# Patient Record
Sex: Female | Born: 2000 | Race: Black or African American | Hispanic: No | Marital: Single | Smoking: Never smoker
Health system: Southern US, Community
[De-identification: ages and names within clinical notes are randomized; demographics above are authoritative.]

## PROBLEM LIST (undated history)

## (undated) DIAGNOSIS — L309 Dermatitis, unspecified: Secondary | ICD-10-CM

## (undated) DIAGNOSIS — J45909 Unspecified asthma, uncomplicated: Secondary | ICD-10-CM

## (undated) DIAGNOSIS — F419 Anxiety disorder, unspecified: Secondary | ICD-10-CM

## (undated) HISTORY — DX: Dermatitis, unspecified: L30.9

## (undated) HISTORY — DX: Unspecified asthma, uncomplicated: J45.909

## (undated) HISTORY — DX: Anxiety disorder, unspecified: F41.9

---
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
## 2021-01-16 ENCOUNTER — Other Ambulatory Visit: Payer: Self-pay | Admitting: Nurse Practitioner

## 2021-01-16 DIAGNOSIS — N6310 Unspecified lump in the right breast, unspecified quadrant: Secondary | ICD-10-CM

## 2021-02-19 ENCOUNTER — Other Ambulatory Visit: Payer: Self-pay | Admitting: Nurse Practitioner

## 2021-02-19 ENCOUNTER — Ambulatory Visit
Admission: RE | Admit: 2021-02-19 | Discharge: 2021-02-19 | Disposition: A | Discharge status: Home / Self Care | Payer: Medicaid Other | Source: Ambulatory Visit | Attending: Nurse Practitioner | Admitting: Nurse Practitioner

## 2021-02-19 DIAGNOSIS — N6310 Unspecified lump in the right breast, unspecified quadrant: Secondary | ICD-10-CM

## 2021-05-15 ENCOUNTER — Other Ambulatory Visit (INDEPENDENT_AMBULATORY_CARE_PROVIDER_SITE_OTHER): Payer: Medicaid Other

## 2021-05-15 ENCOUNTER — Ambulatory Visit (INDEPENDENT_AMBULATORY_CARE_PROVIDER_SITE_OTHER): Payer: Medicaid Other | Admitting: Gastroenterology

## 2021-05-15 ENCOUNTER — Encounter: Payer: Self-pay | Admitting: Gastroenterology

## 2021-05-15 VITALS — BP 100/62 | HR 102 | Ht 69.0 in | Wt 140.4 lb

## 2021-05-15 DIAGNOSIS — K219 Gastro-esophageal reflux disease without esophagitis: Secondary | ICD-10-CM | POA: Diagnosis not present

## 2021-05-15 DIAGNOSIS — R1110 Vomiting, unspecified: Secondary | ICD-10-CM | POA: Diagnosis not present

## 2021-05-15 DIAGNOSIS — R110 Nausea: Secondary | ICD-10-CM

## 2021-05-15 DIAGNOSIS — R1310 Dysphagia, unspecified: Secondary | ICD-10-CM

## 2021-05-15 DIAGNOSIS — Z862 Personal history of diseases of the blood and blood-forming organs and certain disorders involving the immune mechanism: Secondary | ICD-10-CM

## 2021-05-15 LAB — CBC WITH DIFFERENTIAL/PLATELET
Basophils Absolute: 0 10*3/uL (ref 0.0–0.1)
Basophils Relative: 0.8 % (ref 0.0–3.0)
Eosinophils Absolute: 0 10*3/uL (ref 0.0–0.7)
Eosinophils Relative: 0.8 % (ref 0.0–5.0)
HCT: 36 % (ref 36.0–46.0)
Hemoglobin: 11.2 g/dL — ABNORMAL LOW (ref 12.0–15.0)
Lymphocytes Relative: 47.2 % — ABNORMAL HIGH (ref 12.0–46.0)
Lymphs Abs: 2.4 10*3/uL (ref 0.7–4.0)
MCHC: 31.1 g/dL (ref 30.0–36.0)
MCV: 76.5 fl — ABNORMAL LOW (ref 78.0–100.0)
Monocytes Absolute: 0.5 10*3/uL (ref 0.1–1.0)
Monocytes Relative: 10 % (ref 3.0–12.0)
Neutro Abs: 2.1 10*3/uL (ref 1.4–7.7)
Neutrophils Relative %: 41.2 % — ABNORMAL LOW (ref 43.0–77.0)
Platelets: 385 10*3/uL (ref 150.0–400.0)
RBC: 4.71 Mil/uL (ref 3.87–5.11)
RDW: 18.7 % — ABNORMAL HIGH (ref 11.5–14.6)
WBC: 5.2 10*3/uL (ref 4.5–10.5)

## 2021-05-15 LAB — COMPREHENSIVE METABOLIC PANEL
ALT: 8 U/L (ref 0–35)
AST: 17 U/L (ref 0–37)
Albumin: 5 g/dL (ref 3.5–5.2)
Alkaline Phosphatase: 52 U/L (ref 39–117)
BUN: 12 mg/dL (ref 6–23)
CO2: 24 mEq/L (ref 19–32)
Calcium: 10.1 mg/dL (ref 8.4–10.5)
Chloride: 101 mEq/L (ref 96–112)
Creatinine, Ser: 0.83 mg/dL (ref 0.40–1.20)
GFR: 101.31 mL/min (ref >60.00)
Glucose, Bld: 88 mg/dL (ref 70–99)
Potassium: 4.1 mEq/L (ref 3.5–5.1)
Sodium: 133 mEq/L — ABNORMAL LOW (ref 135–145)
Total Bilirubin: 0.4 mg/dL (ref 0.2–1.2)
Total Protein: 8.4 g/dL — ABNORMAL HIGH (ref 6.0–8.3)

## 2021-05-15 MED ORDER — PANTOPRAZOLE SODIUM 40 MG PO TBEC: 40.0000 mg | DELAYED_RELEASE_TABLET | Freq: Two times a day (BID) | ORAL | 3 refills | Status: AC

## 2021-05-15 NOTE — Progress Notes (Signed)
? ?Referring Provider: Deloris Ping, NP ?Primary Care Physician:  System, Provider Not In ? ?Reason for Consultation:  Loss of appetite ? ? ?IMPRESSION:  ?Reflux ?Dysphagia ?Nausea ?Rumination ?Globus ?Weight loss ?History of anemia ?Anxiety - which may be worsening GI symptoms ? ? ?PLAN: ?- CBC, CMP (she will send recent lab results so that these don't need to be drawn today) ?- H pylori, TTGA , IgA ?- Pantoprazole 40 mg BID ?- Upper GI series because she would prefer to start the evaluation without endoscopy ?- EGD with biopsies if UGI series is non-diagnostic ? ?HPI: Karen Blackburn is a 21 y.o. female referred by NP Margo Aye at [facility] for loss of appetite, reflux, and difficulty swallowing.  She has anemia, anxiety, asthma, and depression. She is a Medical sales representative. ? ?She reports many years of poor appetite, nausea, abdominal pain, intermittent dysphagia. There has been an associated weight loss.  She notes having rumination.  Symptom worse during the week leading up to her period. Even with better control of her anxiety her GI symptoms have not improved.  ? ?She has tried probiotics and Mylanta.  ? ?She thinks she has been anemic but I do not have any prior labs to know her baseline.  ? ?There is no known family history of colon cancer or polyps. No family history of stomach cancer or other GI malignancy. No family history of inflammatory bowel disease or celiac.  ? ? ?Past Medical History:  ?Diagnosis Date  ? Anxiety   ? Asthma   ? Eczema   ? ? ?History reviewed. No pertinent surgical history. ? ? ?Current Outpatient Medications  ?Medication Sig Dispense Refill  ? pantoprazole (PROTONIX) 40 MG tablet Take 1 tablet (40 mg total) by mouth 2 (two) times daily before a meal. 60 tablet 3  ? ?No current facility-administered medications for this visit.  ? ? ?Allergies as of 05/15/2021 - never reviewed  ?Allergen Reaction Noted  ? Benadryl [diphenhydramine] Rash 05/15/2021  ? Pimecrolimus   08/06/2010  ? ? ?Family History  ?Problem Relation Age of Onset  ? Heart disease Mother   ? Stomach cancer Father   ? Colon cancer Neg Hx   ? Rectal cancer Neg Hx   ? Esophageal cancer Neg Hx   ? ? ?Social History  ? ?Socioeconomic History  ? Marital status: Single  ?  Spouse name: Not on file  ? Number of children: Not on file  ? Years of education: Not on file  ? Highest education level: Not on file  ?Occupational History  ? Occupation: student  ?Tobacco Use  ? Smoking status: Never  ? Smokeless tobacco: Never  ?Vaping Use  ? Vaping Use: Never used  ?Substance and Sexual Activity  ? Alcohol use: Never  ? Drug use: Not Currently  ?  Types: Marijuana  ? Sexual activity: Not on file  ?Other Topics Concern  ? Not on file  ?Social History Narrative  ? Not on file  ? ?Social Determinants of Health  ? ?Financial Resource Strain: Not on file  ?Food Insecurity: Not on file  ?Transportation Needs: Not on file  ?Physical Activity: Not on file  ?Stress: Not on file  ?Social Connections: Not on file  ?Intimate Partner Violence: Not on file  ? ? ?Review of Systems: ?12 system ROS is negative except as noted above with the addition of anxiety and depression.  ? ?Physical Exam: ?General:   Alert,  well-nourished, pleasant and cooperative in  NAD ?Head:  Normocephalic and atraumatic. ?Eyes:  Sclera clear, no icterus.   Conjunctiva pink. ?Ears:  Normal auditory acuity. ?Nose:  No deformity, discharge,  or lesions. ?Mouth:  No deformity or lesions.   ?Neck:  Supple; no masses or thyromegaly. ?Lungs:  Clear throughout to auscultation.   No wheezes. ?Heart:  Regular rate and rhythm; no murmurs. ?Abdomen:  Soft, nontender, nondistended, normal bowel sounds, no rebound or guarding. No hepatosplenomegaly.   ?Rectal:  Deferred  ?Msk:  Symmetrical. No boney deformities ?LAD: No inguinal or umbilical LAD ?Extremities:  No clubbing or edema. ?Neurologic:  Alert and  oriented x4;  grossly nonfocal ?Skin:  Intact without significant lesions or  rashes. ?Psych:  Alert and cooperative. Normal mood and affect. ? ? ? ? ?Kariann Wecker L. Orvan Falconer, MD, MPH ?05/17/2021, 2:13 PM ? ? ? ?  ?

## 2021-05-15 NOTE — Patient Instructions (Signed)
If you are age 21 or older, your body mass index should be between 23-30. Your Body mass index is 20.73 kg/m?Marland Kitchen If this is out of the aforementioned range listed, please consider follow up with your Primary Care Provider. ? ?If you are age 43 or younger, your body mass index should be between 19-25. Your Body mass index is 20.73 kg/m?Marland Kitchen If this is out of the aformentioned range listed, please consider follow up with your Primary Care Provider.  ? ?________________________________________________________ ? ?The [facility] providers would like to encourage you to use [facility] to communicate with providers for non-urgent requests or questions.  Due to long hold times on the telephone, sending your provider a message by [facility] may be a faster and more efficient way to get a response.  Please allow 48 business hours for a response.  Please remember that this is for non-urgent requests.  ?_______________________________________________________ ? ?It has been recommended to you by your physician that you have an EGD completed. Per your request, we did not schedule the procedure today. Please contact our office at [phone] should you decide to have the procedure completed. You will be scheduled for a pre-visit and procedure at that time.  ? ?Your provider has requested that you go to the basement level for lab work before leaving today. Press "B" on the elevator. The lab is located at the first door on the left as you exit the elevator. ? ?You have been scheduled for an Upper GI Series at [facility]. Your appointment is on 05-29-2021 at 930am. Please arrive 30 minutes prior to your test for registration. Make sure not to eat or drink anything after midnight on the night before your test. If you need to reschedule, please call radiology at [phone]. ?________________________________________________________________ ?An upper GI series uses x rays to help diagnose problems of the upper GI tract, which includes  the esophagus, stomach, and duodenum. The duodenum is the first part of the small intestine. ?An upper GI series is conducted by a radiology technologist or a radiologist--a doctor who specializes in x-ray imaging--at a hospital or outpatient center. ?While sitting or standing in front of an x-ray machine, the patient drinks barium liquid, which is often white and has a chalky consistency and taste. The barium liquid coats the lining of the upper GI tract and makes signs of disease show up more clearly on x rays. X-ray video, called fluoroscopy, is used to view the barium liquid moving through the esophagus, stomach, and duodenum. ?Additional x rays and fluoroscopy are performed while the patient lies on an x-ray table. To fully coat the upper GI tract with barium liquid, the technologist or radiologist may press on the abdomen or ask the patient to change position. Patients hold still in various positions, allowing the technologist or radiologist to take x rays of the upper GI tract at different angles. If a technologist conducts the upper GI series, a radiologist will later examine the images to look for problems. ? ?This test typically takes about 1 hour to complete. ?__________________________________________________________________ ? ? ?Due to recent changes in healthcare laws, you may see the results of your imaging and laboratory studies on MyChart before your provider has had a chance to review them.  We understand that in some cases there may be results that are confusing or concerning to you. Not all laboratory results come back in the same time frame and the provider may be waiting for multiple results in order to interpret others.  Please  give [place] 48 hours in order for your provider to thoroughly review all the results before contacting the office for clarification of your results.  ? ?Follow up with  ? ?It was a pleasure to see you today! ? ?Thank you for trusting me with your gastrointestinal care!    ? ? ? ?

## 2021-05-16 LAB — IGA: Immunoglobulin A: 131 mg/dL (ref 47–310)

## 2021-05-16 LAB — TISSUE TRANSGLUTAMINASE, IGA: (tTG) Ab, IgA: <1 U/mL

## 2021-05-17 ENCOUNTER — Encounter: Payer: Self-pay | Admitting: Gastroenterology

## 2021-05-18 ENCOUNTER — Encounter: Payer: Self-pay | Admitting: Gastroenterology

## 2021-05-20 ENCOUNTER — Other Ambulatory Visit: Payer: Self-pay

## 2021-05-20 DIAGNOSIS — Z862 Personal history of diseases of the blood and blood-forming organs and certain disorders involving the immune mechanism: Secondary | ICD-10-CM

## 2021-05-23 ENCOUNTER — Other Ambulatory Visit: Payer: Medicaid Other

## 2021-05-23 DIAGNOSIS — Z862 Personal history of diseases of the blood and blood-forming organs and certain disorders involving the immune mechanism: Secondary | ICD-10-CM

## 2021-05-23 DIAGNOSIS — R110 Nausea: Secondary | ICD-10-CM

## 2021-05-23 DIAGNOSIS — R1310 Dysphagia, unspecified: Secondary | ICD-10-CM

## 2021-05-23 DIAGNOSIS — K219 Gastro-esophageal reflux disease without esophagitis: Secondary | ICD-10-CM

## 2021-05-23 DIAGNOSIS — R1110 Vomiting, unspecified: Secondary | ICD-10-CM

## 2021-05-24 LAB — HELICOBACTER PYLORI  SPECIAL ANTIGEN
MICRO NUMBER:: 13259586
SPECIMEN QUALITY: ADEQUATE

## 2021-05-24 LAB — IRON,TIBC AND FERRITIN PANEL
%SAT: 6 % (calc) — ABNORMAL LOW (ref 16–45)
Ferritin: 3 ng/mL — ABNORMAL LOW (ref 16–154)
Iron: 28 ug/dL — ABNORMAL LOW (ref 40–190)
TIBC: 460 mcg/dL (calc) — ABNORMAL HIGH (ref 250–450)

## 2021-05-27 ENCOUNTER — Other Ambulatory Visit: Payer: Self-pay

## 2021-05-27 DIAGNOSIS — D508 Other iron deficiency anemias: Secondary | ICD-10-CM

## 2021-05-29 ENCOUNTER — Encounter (HOSPITAL_COMMUNITY): Payer: Self-pay | Admitting: Radiology

## 2021-05-29 ENCOUNTER — Encounter: Payer: Self-pay | Admitting: Gastroenterology

## 2021-05-29 ENCOUNTER — Ambulatory Visit (HOSPITAL_COMMUNITY)
Admission: RE | Admit: 2021-05-29 | Discharge: 2021-05-29 | Disposition: A | Discharge status: Home / Self Care | Payer: Medicaid Other | Source: Ambulatory Visit | Attending: Gastroenterology | Admitting: Gastroenterology

## 2021-05-29 DIAGNOSIS — R110 Nausea: Secondary | ICD-10-CM | POA: Insufficient documentation

## 2021-05-29 DIAGNOSIS — Z862 Personal history of diseases of the blood and blood-forming organs and certain disorders involving the immune mechanism: Secondary | ICD-10-CM | POA: Diagnosis present

## 2021-05-29 DIAGNOSIS — R1310 Dysphagia, unspecified: Secondary | ICD-10-CM | POA: Diagnosis present

## 2021-05-29 DIAGNOSIS — K219 Gastro-esophageal reflux disease without esophagitis: Secondary | ICD-10-CM | POA: Diagnosis present

## 2021-05-29 DIAGNOSIS — R1110 Vomiting, unspecified: Secondary | ICD-10-CM | POA: Insufficient documentation

## 2021-05-29 NOTE — Progress Notes (Signed)
Noted  

## 2021-05-31 ENCOUNTER — Telehealth: Payer: Self-pay

## 2021-05-31 ENCOUNTER — Encounter: Payer: Self-pay | Admitting: Gastroenterology

## 2021-05-31 NOTE — Telephone Encounter (Signed)
Numerous attempts have been made on behalf of the [facility] to schedule pt as indicated below. In addition, we have sent pt a My Chart message that she has read providing her with [facility] contact #. Letter has also been mailed to pt home address.  ? ?Referral Notes ?Number of Notes: 6 ?. ?Type Date User Summary Attachment  ?General 05/31/2021  8:35 AM Shirlee More Called pt, no answer. Left msg for pt to call back to sch appt.   -  ?Note:   ?Called pt, no answer. Left msg for pt to call back to sch appt.   ?. ?Type Date User Summary Attachment  ?General 05/30/2021  8:35 AM Shirlee More Called pt, no answer. Left msg for pt to call back to sch appt.   -  ?Note:   ?Called pt, no answer. Left msg for pt to call back to sch appt.   ?. ?Type Date User Summary Attachment  ?General 05/29/2021  8:26 AM Shirlee More Called pt, no answer. Left msg for pt to call back to sch appt.   -  ?Note:   ?Called pt, no answer. Left msg for pt to call back to sch appt.   ?. ?Type Date User Summary Attachment  ?General 05/28/2021  9:14 AM Shirlee More - -  ?Note:   ?Called pt, no answer. Left msg for pt to call back to sch appt.   ?

## 2021-06-01 ENCOUNTER — Telehealth: Payer: Self-pay | Admitting: Gastroenterology

## 2021-06-01 NOTE — Telephone Encounter (Signed)
[facility] of [facility].  Patient seen 03/14/2021 for reflux and anxiety ? ?Seen in student [facility] for evaluation of epigastric pain that is worsened over the last month.  She has been seen numerous times at student health for the same.  She reports a gnawing sensation in her epigastric region but is also full simultaneously.  States poor appetite due to this.  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  There is no nausea vomiting, diarrhea, constipation.  Uses ibuprofen regularly when menstruating. ? ?She has a history of depression and anxiety and was prescribed Prozac but has been noncompliant because of things her friends told her about her mood when she was taking it.  Has not seen psych since April 2022.  States that her anxiety is under control. ? ?They recommended that she reestablish care with psychiatry and felt that her symptoms may be caused by underlying mental health troubles. ? ?Liver enzymes normal.  Hemoglobin 10.8, MCV 96.6, RDW 16.5, platelets 401.  Calcium 10.5.  Total protein 8.9.  Albumin 5.3. ?

## 2021-06-03 ENCOUNTER — Telehealth: Payer: Self-pay | Admitting: Physician Assistant

## 2021-06-03 NOTE — Telephone Encounter (Signed)
Scheduled appt per 4/17 referral. Pt is aware of appt date and time. Pt is aware to arrive 15 mins prior to appt time and to bring and updated insurance card. Pt is aware of appt location.   ?

## 2021-06-04 NOTE — Progress Notes (Deleted)
Recall placed per Dr. Dr. Orvan Falconer request. Letter sent via My Chart and mailed to home address. ? ?

## 2021-06-05 ENCOUNTER — Encounter: Payer: Self-pay | Admitting: Gastroenterology

## 2021-06-06 ENCOUNTER — Ambulatory Visit: Payer: Medicaid Other | Admitting: Nurse Practitioner

## 2021-06-17 NOTE — Progress Notes (Signed)
? ? Karen Blackburn ?[phone]   Fax:([phone]) [phone] ? ?CONSULT NOTE ? ?REFERRING PHYSICIAN: Dr. Tarri Glenn ? ?REASON FOR CONSULTATION:  ?Iron deficiency anemia ? ?HPI ?Karen Blackburn is a 21 y.o. female with no significant past medical history except for right breast lump is referred to the clinic for iron deficiency anemia. ? ?The patient's evaluation began when she saw Dr. Tarri Glenn on 05/15/2021 for the chief complaint of loss of appetite.  She reports that her baseline weight at the beginning of the year was around 150 pounds.  She states at the time of her GI evaluation a few months later, that her weight was closer to 135 pounds.  She had extensive blood testing performed as well as upper GI series.  Since her upper GI series was unremarkable, they are going to proceed with an EGD.  This is not scheduled at this time.  The patient is going to be going home to [place] for summer break from college soon.  She is considering being referred to GI in [place] to continue her evaluation.  The patient had a CBC performed by Dr. Tarri Glenn office which showed mild anemia with a low  hemoglobin of 11.2 and a low MCV at 76.5.  Her iron studies showed iron deficiency anemia with a ferritin of 3, iron low at 28, elevated TIBC at 460, and a low saturation at 6%.  She was referred to the clinic for her iron deficiency anemia.  She started taking an iron supplement approximately 4 weeks ago.  She has been fairly compliant with taking this p.o. daily but reports she slacked off the last week and a half or so.  She is taking an over-the-counter iron supplement called for her globin.  She is not sure with the iron content is in this.  She also has an old iron prescription at the house. ? ?I do not have any prior records. To her knowledge, she started having anemia since she was in high school.  She denies ever needing a blood transfusion or an iron infusion before.  ? ?She denies heavy menstrual bleeding and  reports her menstrual cycles last approximately 4 days and that they are only heavy the second day.  On the second day of her cycle, she estimates that she will need 5-6 tampons.  She reports a few months ago she was having 2 menstrual cycles per month but this lasted only 1 to 2 months.   ? ?Regarding symptoms, she reports fatigue.   She denies any lightheadedness or dyspnea on exertion.  She reports she had palpitations "now and then" prior to starting her iron supplements.  She had 1 recent episode of self-limiting epistaxis but otherwise denies history of epistaxis.  She also thinks that she may have had a little bit of bright red blood on the toilet paper after wiping with a recent bowel movement.  She denies gingival bleeding, hemoptysis, hematemesis, or melena.  Denies any changes in bowel habits.  Denies any fever, chills, or night sweats.  She denies any lymphadenopathy. Denies any NSAID use except she may take 2 tablets during her menstrual cycle.   She reports that she has a stable breast lump. She had an ultrasound of this performed a few months ago which recommended follow up in 6 months.  This is scheduled for 08/22/2021.  The patient thinks that she may be in [place] at the time that this is scheduled. Denies any chronic kidney disease. She denies any particular dietary  habits such as being a vegan or vegetarian, but she does not eat a lot of red meat.  She estimates she eats red meat approximately once or twice a week.  Does not get a lot of red meat, maybe 1 or twice.  Denies any history of any bariatric surgery.  She reports craving ice chips. ? ?Her family medical history consist of a father who had stomach cancer.  He was diagnosed in his 50s.  He passed away at the age of 57.  The patient reports her sister has sickle cell disease, although it sounds like she fortunately has not had a lot of complications from this.  The patient denies being tested for this.  Otherwise her mother has a  pacemaker and high cholesterol.  He denies any other family history of any blood disorders or cancers. ? ?The patient is a Ship broker.  She is single does not have any children.  She denies any drug, alcohol, or tobacco use. ? ?   ?HPI ? ?Past Medical History:  ?Diagnosis Date  ? Anxiety   ? Asthma   ? Eczema   ? ? ?No past surgical history on file. ? ?Family History  ?Problem Relation Age of Onset  ? Heart disease Mother   ? Stomach cancer Father   ? Colon cancer Neg Hx   ? Rectal cancer Neg Hx   ? Esophageal cancer Neg Hx   ? ? ?Social History ?Social History  ? ?Tobacco Use  ? Smoking status: Never  ? Smokeless tobacco: Never  ?Vaping Use  ? Vaping Use: Never used  ?Substance Use Topics  ? Alcohol use: Never  ? Drug use: Not Currently  ?  Types: Marijuana  ? ? ?Allergies  ?Allergen Reactions  ? Benadryl [Diphenhydramine] Rash  ? Pimecrolimus   ? ? ?Current Outpatient Medications  ?Medication Sig Dispense Refill  ? cetirizine HCl (ZYRTEC) 5 MG/5ML SOLN Take 85ml by mouth daily for allergies and itching    ? alum & mag hydroxide-simeth (MAALOX PLUS) 400-400-40 MG/5ML suspension Take 10 mLs by mouth 4 (four) times daily as needed. (Patient not taking: Reported on 06/18/2021)    ? pantoprazole (PROTONIX) 40 MG tablet Take 1 tablet (40 mg total) by mouth 2 (two) times daily before a meal. (Patient not taking: Reported on 06/18/2021) 60 tablet 3  ? triamcinolone ointment (KENALOG) 0.1 % Apply topically 2 (two) times daily. (Patient not taking: Reported on 06/18/2021)    ? ?No current facility-administered medications for this visit.  ? ? ?REVIEW OF SYSTEMS:   ?Review of Systems  ?Constitutional: Positive for fatigue.  Positive for unexplained weight loss.  Negative for appetite change, chills, and fever.  ?HENT: Negative for mouth sores, nosebleeds, sore throat and trouble swallowing.   ?Eyes: Negative for eye problems and icterus.  ?Respiratory: Negative for cough, hemoptysis, shortness of breath and wheezing.    ?Cardiovascular: Negative for chest pain and leg swelling.  ?Gastrointestinal: Negative for abdominal pain, constipation, diarrhea, nausea and vomiting.  ?Genitourinary: Negative for bladder incontinence, difficulty urinating, dysuria, frequency and hematuria.   ?Musculoskeletal: Negative for back pain, gait problem, neck pain and neck stiffness.  ?Skin: Negative for itching and rash.  ?Neurological: Negative for dizziness, extremity weakness, gait problem, headaches, light-headedness and seizures.  ?Hematological: Negative for adenopathy. Does not bruise/bleed easily.  ?Psychiatric/Behavioral: Negative for confusion, depression and sleep disturbance. The patient is not nervous/anxious.   ? ? ?PHYSICAL EXAMINATION:  ?Blood pressure 138/79, pulse 85, temperature 98.1 ?F (36.7 ?C),  temperature source Oral, resp. rate 17, height 5\' 9"  (1.753 m), weight 145 lb 1.6 oz (65.8 kg), last menstrual period 05/20/2021, SpO2 100 %, unknown if currently breastfeeding. ? ?ECOG PERFORMANCE STATUS: 1 ? ?Physical Exam  ?Constitutional: Oriented to person, place, and time and well-developed, well-nourished, and in no distress.  ?HENT:  ?Head: Normocephalic and atraumatic.  ?Mouth/Throat: Oropharynx is clear and moist. No oropharyngeal exudate.  ?Eyes: Conjunctivae are normal. Right eye exhibits no discharge. Left eye exhibits no discharge. No scleral icterus.  ?Neck: Normal range of motion. Neck supple.  ?Cardiovascular: Normal rate, regular rhythm, normal heart sounds and intact distal pulses.   ?Pulmonary/Chest: Effort normal and breath sounds normal. No respiratory distress. No wheezes. No rales.  ?Abdominal: Soft. Bowel sounds are normal. Exhibits no distension and no mass. There is no tenderness.  ?Musculoskeletal: Normal range of motion. Exhibits no edema.  ?Lymphadenopathy:  ?  No cervical adenopathy.  ?Neurological: Alert and oriented to person, place, and time. Exhibits normal muscle tone. Gait normal. Coordination normal.   ?Skin: Skin is warm and dry. No rash noted. Not diaphoretic. No erythema. No pallor.  ?Psychiatric: Mood, memory and judgment normal.  ?Vitals reviewed. ? ?LABORATORY DATA: ?Lab Results  ?Component Value Date  ? WBC 5.7 05/0

## 2021-06-18 ENCOUNTER — Inpatient Hospital Stay: Payer: Medicaid Other | Attending: Physician Assistant | Admitting: Physician Assistant

## 2021-06-18 ENCOUNTER — Other Ambulatory Visit: Payer: Self-pay | Admitting: Physician Assistant

## 2021-06-18 ENCOUNTER — Other Ambulatory Visit: Payer: Self-pay

## 2021-06-18 ENCOUNTER — Inpatient Hospital Stay: Payer: Medicaid Other

## 2021-06-18 DIAGNOSIS — R002 Palpitations: Secondary | ICD-10-CM | POA: Diagnosis not present

## 2021-06-18 DIAGNOSIS — R634 Abnormal weight loss: Secondary | ICD-10-CM | POA: Diagnosis not present

## 2021-06-18 DIAGNOSIS — D509 Iron deficiency anemia, unspecified: Secondary | ICD-10-CM

## 2021-06-18 DIAGNOSIS — Z800 Family history of malignant neoplasm of digestive organs: Secondary | ICD-10-CM | POA: Diagnosis not present

## 2021-06-18 DIAGNOSIS — R5383 Other fatigue: Secondary | ICD-10-CM | POA: Insufficient documentation

## 2021-06-18 LAB — CMP (CANCER CENTER ONLY)
ALT: 11 U/L (ref 0–44)
AST: 20 U/L (ref 15–41)
Albumin: 4.4 g/dL (ref 3.5–5.0)
Alkaline Phosphatase: 45 U/L (ref 38–126)
Anion gap: 9 (ref 5–15)
BUN: 10 mg/dL (ref 6–20)
CO2: 24 mmol/L (ref 22–32)
Calcium: 9.7 mg/dL (ref 8.9–10.3)
Chloride: 105 mmol/L (ref 98–111)
Creatinine: 0.72 mg/dL (ref 0.44–1.00)
GFR, Estimated: >60 mL/min (ref >60)
Glucose, Bld: 93 mg/dL (ref 70–99)
Potassium: 3.9 mmol/L (ref 3.5–5.1)
Sodium: 138 mmol/L (ref 135–145)
Total Bilirubin: 0.4 mg/dL (ref 0.3–1.2)
Total Protein: 8.1 g/dL (ref 6.5–8.1)

## 2021-06-18 LAB — CBC WITH DIFFERENTIAL (CANCER CENTER ONLY)
Abs Immature Granulocytes: 0 10*3/uL (ref 0.00–0.07)
Basophils Absolute: 0 10*3/uL (ref 0.0–0.1)
Basophils Relative: 1 %
Eosinophils Absolute: 0.1 10*3/uL (ref 0.0–0.5)
Eosinophils Relative: 2 %
HCT: 34.8 % — ABNORMAL LOW (ref 36.0–46.0)
Hemoglobin: 10.9 g/dL — ABNORMAL LOW (ref 12.0–15.0)
Immature Granulocytes: 0 %
Lymphocytes Relative: 59 %
Lymphs Abs: 3.4 10*3/uL (ref 0.7–4.0)
MCH: 24.8 pg — ABNORMAL LOW (ref 26.0–34.0)
MCHC: 31.3 g/dL (ref 30.0–36.0)
MCV: 79.1 fL — ABNORMAL LOW (ref 80.0–100.0)
Monocytes Absolute: 0.3 10*3/uL (ref 0.1–1.0)
Monocytes Relative: 5 %
Neutro Abs: 1.9 10*3/uL (ref 1.7–7.7)
Neutrophils Relative %: 33 %
Platelet Count: 354 10*3/uL (ref 150–400)
RBC: 4.4 MIL/uL (ref 3.87–5.11)
RDW: 17.8 % — ABNORMAL HIGH (ref 11.5–15.5)
WBC Count: 5.7 10*3/uL (ref 4.0–10.5)
nRBC: 0 % (ref 0.0–0.2)

## 2021-06-18 LAB — VITAMIN B12: Vitamin B-12: 701 pg/mL (ref 180–914)

## 2021-06-18 LAB — IRON AND IRON BINDING CAPACITY (CC-WL,HP ONLY)
Iron: 23 ug/dL — ABNORMAL LOW (ref 28–170)
Saturation Ratios: 4 % — ABNORMAL LOW (ref 10.4–31.8)
TIBC: 564 ug/dL — ABNORMAL HIGH (ref 250–450)
UIBC: 541 ug/dL

## 2021-06-18 LAB — FOLATE: Folate: 11.1 ng/mL (ref >5.9)

## 2021-06-18 LAB — FERRITIN: Ferritin: 4 ng/mL — ABNORMAL LOW (ref 11–307)

## 2021-06-20 ENCOUNTER — Other Ambulatory Visit: Payer: Self-pay

## 2021-06-20 DIAGNOSIS — D509 Iron deficiency anemia, unspecified: Secondary | ICD-10-CM

## 2021-06-20 NOTE — Progress Notes (Signed)
Referral sent to the following: ? ?[facility] ?PH: [phone] ?FX: [phone] ?

## 2021-07-01 ENCOUNTER — Telehealth: Payer: Self-pay | Admitting: Gastroenterology

## 2021-07-01 NOTE — Telephone Encounter (Signed)
We received a call from [facility] from [facility], stating that they received a referral from [facility] to them for iron deficiency anemia. Per Maureen Ralphs, called patient to schedule, however, patient states being unaware of referral, declined scheduling with them.

## 2021-07-02 NOTE — Telephone Encounter (Signed)
Message routed erroneously. Routing to pt provider with [facility]

## 2021-07-04 NOTE — Telephone Encounter (Signed)
I spoke with the pt and advised as indicated. She understands why she was referred and where her referrals came from. Pt states she will call them back and schedule her appts.

## 2021-08-08 NOTE — Progress Notes (Signed)
Formatting of this note might be different from the original.  Gardasil #1 of 3 given today IM Lt. Deltoid without problems.  Patient waited 15 minutes with no allergic reaction symptoms. She scheduled her next vaccine 2 months from now.  Electronically signed by Christin Fudge, RN at 08/08/2021 11:47 PM EDT

## 2021-08-08 NOTE — Progress Notes (Signed)
Formatting of this note is different from the original.  Loretta Spence is 21 y.o. female [place] who is here today referred for evaluation of iron deficiency anemia by Vergia Alcon, MD She is currently here for the summer and goes to college in [place].  She has had a thorough GI evaluation and has been ruled out for celiac and other possible etiologies for anemia.  Her blood work shows mild anemia and her iron studies showed iron deficiency. Has been seen by a hematologist and placed on an iron supplement orally which she has been fairly compliant with but states she is not liking how it makes her feel.    Patient reports she has been anemic since high school.  She reports her cycles are regular every month lasting for approximately 4 days.  She does report the first 2 days are heavy requiring her to change a super plus tampon every 3-4 hours.  She denies any issues with cramps.  She states her sister also has a history of iron deficiency anemia and heavy cycles.  She denies being currently sexually active but states she has been sexually active in the past.  She denies any need for contraception at this time.  She states she has never had HPV vaccine.     OB History     Gravida   0    Para   0    Term   0    Preterm   0    AB   0    Living   0      SAB   0    IAB   0    Ectopic   0    Multiple   0    Live Births   0           Past Medical History:   Diagnosis Date   ? Anemia    ? Asthma    ? Breast lump in female    ? Eczema      History reviewed. No pertinent surgical history.    Social History     Tobacco Use   ? Smoking status: Never   ? Smokeless tobacco: Never   Substance Use Topics   ? Alcohol use: No   ? Drug use: Never     Allergies   Allergen Reactions   ? Diphenhydramine Rash     Outpatient Medications Prior to Visit   Medication Sig Dispense Refill   ? aluminum & mag hydroxide-simethicone (MAALOX PLUS,MYLANTA DS,ANTACID MAX STRENGTH) 400-400-40 mg/5 mL suspension Take 10 mLs by mouth 4 (four)  times a day as needed.     ? Cetirizine HCl (ZYRTEC CHILDRENS ALLERGY) 5 MG/5ML SYRP Take 37ml by mouth daily for allergies and itching 240 mL 3   ? FERROUS SULFATE 325 (65 FE) MG tablet Take one tablet (325 mg dose) by mouth every other day.     ? triamcinolone (KENALOG) 0.1% ointment APPLY TO THE AFFECTED AREA TWICE DAILY 454 g 0     No facility-administered medications prior to visit.       The remainder of the patient's history was reviewed and updated in the medical record.      Review of Systems  Pertinent items are noted in HPI.      Objective   Patient's last menstrual period was 07/22/2021.  BP 110/70   Ht 5\' 9"  (1.753 m)   Wt 148 lb (67.1 kg)   LMP 07/22/2021  Breastfeeding No   BMI 21.86 kg/m  Body mass index is 21.86 kg/m.     Physical Exam:  General:  Well groomed, in no apparent distress.    Gynecologic:  Normal development for age  External genitalia: condyloma on posterior introitus,#8, 1-2 mmm flat condyloma noted, no ulcerations urethral meatus normal  Urethra:  normal to palpation  Bladder:  normal to palpation  Vagina:  no masses or lesions, no vaginal discharge  Cervix: without lesions, negative CMT  Uterus:  normal in size, texture, & nontender  Bimanual:  no ovarian or adnexal masses or tenderness, parametrium negative    Impression:     1. 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 Iron deficiency anemia due to chronic blood loss    3. Vaginal itching    4. Screen for STD (sexually transmitted disease)    5. Need for HPV vaccine      Plan:     Orders Placed This Encounter   Procedures   ? Wet Prep, Genital Genital   ? Chlamydia / Gonococcus, NAA Swab   ? Gardasil 9 #1 (Today)   ? Hepatitis B Surface Antigen   ? Hepatitis C Antibody   ? RPR   ? HIV p24 Antigen/Antibody With Reflex to Confirmation   Pathophysiology of condyloma discussed. HPV vaccine reviewed.. Requested.  I have sent in the Aldara cream for the warts. Apply a small amount to the dry area with clean hands at  night and leave on for 8 hours while you sleep. Wash the cream off in the morning with mild soap and water. Use the cream 3 x a week.  If the warts appear to be spreading please let me know.  You can try Ibuprofen 600-800mg  every 6-8 hours on the heavy days of your cycle (take with food ) to see if your bleeding decreases. If this does not work I have sent in a prescription of tranexamic acid to your pharmacy if you want to try it. It helps your blood clot sooner and will shorter the days that are heavy in your cycle. Take 2 tablets three x a day for the heavy days of your cycle. You will need to take with food and avoid taking the ibuprofen while on this medicine.  I will plan to see you back after your ultrasound and for your annual exam and pap smear and we will determine if further options are needed. Please call with any questions  Electronically signed by Ellwood Dense, MD at 08/08/2021 11:47 PM EDT

## 2021-08-09 NOTE — Progress Notes (Signed)
Formatting of this note might be different from the original.  As I was entering the Gardasil into the [facility], I noticed the patient already  Had her complete Gardasil series from 05/25/2013-08/15/2014.  Discussed with Dr. Darcella Cheshire and advised to tel patient she needs no further Gardasil vaccines.  Notified patient by phone that no more Gardasil vaccines are needed.  She verbalized understanding.  Electronically signed by Christin Fudge, RN at 08/09/2021 10:47 AM EDT

## 2021-08-22 ENCOUNTER — Other Ambulatory Visit: Payer: Medicaid Other

## 2021-08-29 ENCOUNTER — Telehealth: Payer: Self-pay | Admitting: Physician Assistant

## 2021-08-29 NOTE — Telephone Encounter (Signed)
Called patient regarding upcoming Rescheduled September appointment, voicemail was left.

## 2021-09-10 ENCOUNTER — Telehealth: Payer: Self-pay | Admitting: Internal Medicine

## 2021-09-10 NOTE — Telephone Encounter (Signed)
Called patient regarding upcoming updated appointment, left a voicemail.

## 2021-10-03 ENCOUNTER — Ambulatory Visit
Admission: RE | Admit: 2021-10-03 | Discharge: 2021-10-03 | Disposition: A | Discharge status: Home / Self Care | Payer: Medicaid Other | Source: Ambulatory Visit | Attending: Nurse Practitioner | Admitting: Nurse Practitioner

## 2021-10-03 ENCOUNTER — Other Ambulatory Visit: Payer: Self-pay | Admitting: Nurse Practitioner

## 2021-10-03 DIAGNOSIS — N6310 Unspecified lump in the right breast, unspecified quadrant: Secondary | ICD-10-CM

## 2021-10-03 DIAGNOSIS — N6320 Unspecified lump in the left breast, unspecified quadrant: Secondary | ICD-10-CM

## 2021-10-22 ENCOUNTER — Telehealth: Payer: Self-pay | Admitting: Internal Medicine

## 2021-10-22 ENCOUNTER — Ambulatory Visit: Payer: Medicaid Other | Admitting: Physician Assistant

## 2021-10-22 ENCOUNTER — Other Ambulatory Visit: Payer: Medicaid Other

## 2021-10-22 NOTE — Telephone Encounter (Signed)
Rescheduled 09/13 appointment per patient's request, appointment moved to 09/19.

## 2021-10-23 ENCOUNTER — Inpatient Hospital Stay: Payer: Medicaid Other | Admitting: Internal Medicine

## 2021-10-23 ENCOUNTER — Inpatient Hospital Stay: Payer: Medicaid Other

## 2021-10-23 ENCOUNTER — Other Ambulatory Visit: Payer: Medicaid Other

## 2021-10-23 ENCOUNTER — Ambulatory Visit: Payer: Medicaid Other | Admitting: Physician Assistant

## 2021-10-29 ENCOUNTER — Inpatient Hospital Stay: Payer: Medicaid Other | Admitting: Internal Medicine

## 2021-10-29 ENCOUNTER — Inpatient Hospital Stay: Payer: Medicaid Other | Attending: Physician Assistant

## 2021-10-29 ENCOUNTER — Telehealth: Payer: Self-pay

## 2021-10-29 NOTE — Telephone Encounter (Signed)
I have left a message for this pt advising she has no showed her appts this morning. Given that pt has no showed and cancelled multiple appts, I have advised in my detailed message that she can contact us if she wants to be seen and we will not automatically reschedule her. I have left our contact number.

## 2022-04-07 ENCOUNTER — Ambulatory Visit
Admission: RE | Admit: 2022-04-07 | Discharge: 2022-04-07 | Disposition: A | Discharge status: Home / Self Care | Payer: Medicaid Other | Source: Ambulatory Visit | Attending: Nurse Practitioner | Admitting: Nurse Practitioner

## 2022-04-07 DIAGNOSIS — N6310 Unspecified lump in the right breast, unspecified quadrant: Secondary | ICD-10-CM

## 2022-05-11 ENCOUNTER — Emergency Department: Admit: 2022-05-11 | Payer: PRIVATE HEALTH INSURANCE

## 2022-05-11 ENCOUNTER — Inpatient Hospital Stay
Admit: 2022-05-11 | Discharge: 2022-05-12 | Disposition: A | Discharge status: Home / Self Care | Payer: PRIVATE HEALTH INSURANCE | Attending: Emergency Medicine

## 2022-05-11 ENCOUNTER — Emergency Department: Admit: 2022-05-11 | Payer: Auto Insurance (includes no fault)

## 2022-05-11 DIAGNOSIS — M25559 Pain in unspecified hip: Secondary | ICD-10-CM
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
## 2022-05-12 MED ORDER — CYCLOBENZAPRINE HCL 10 MG PO TABS
10 | Freq: Once | ORAL | Status: AC
Start: 2022-05-12 — End: 2022-05-11
  Administered 2022-05-12: 01:00:00 10 mg via ORAL

## 2022-05-12 MED FILL — CYCLOBENZAPRINE HCL 10 MG PO TABS: 10 MG | ORAL | Qty: 1

## 2023-04-16 IMAGING — US US BREAST*R* LIMITED INC AXILLA
1 series · 6 of 6 positions shown · non-contrast
Comparison: None.

CLINICAL DATA: Palpable lump in the right breast

EXAM:
ULTRASOUND OF THE RIGHT BREAST

[Series 1: us breast*right* limited inc axilla · 0.07mm/px · 6 of 6 slices shown]
[im 1/6]
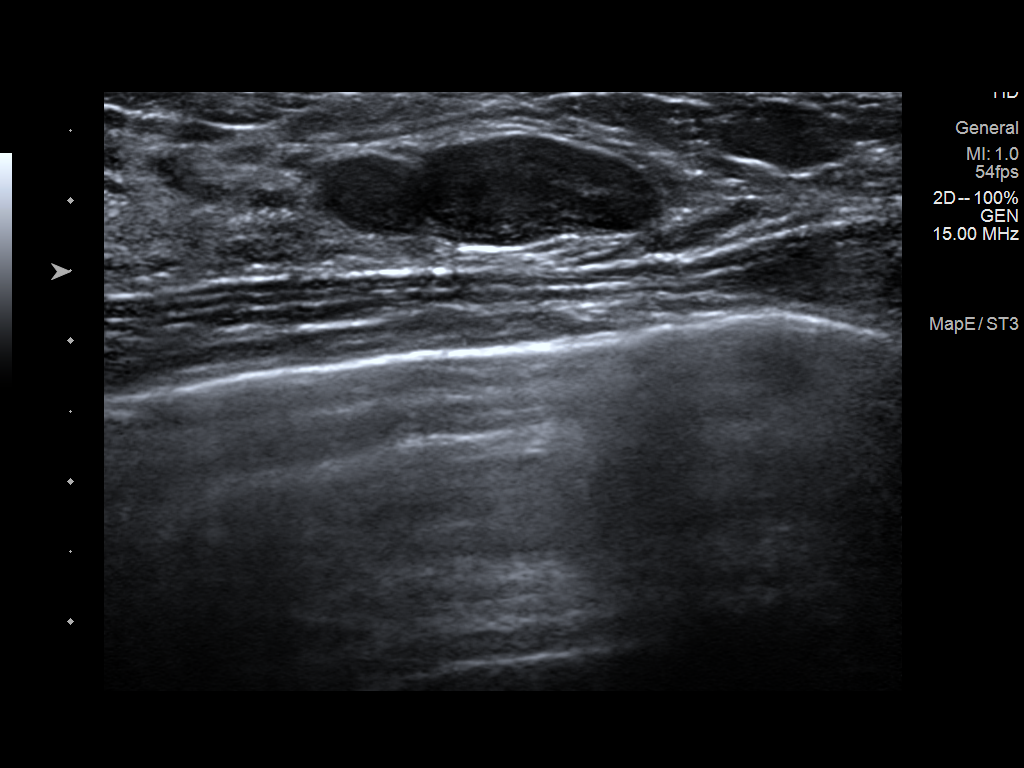
[im 2/6]
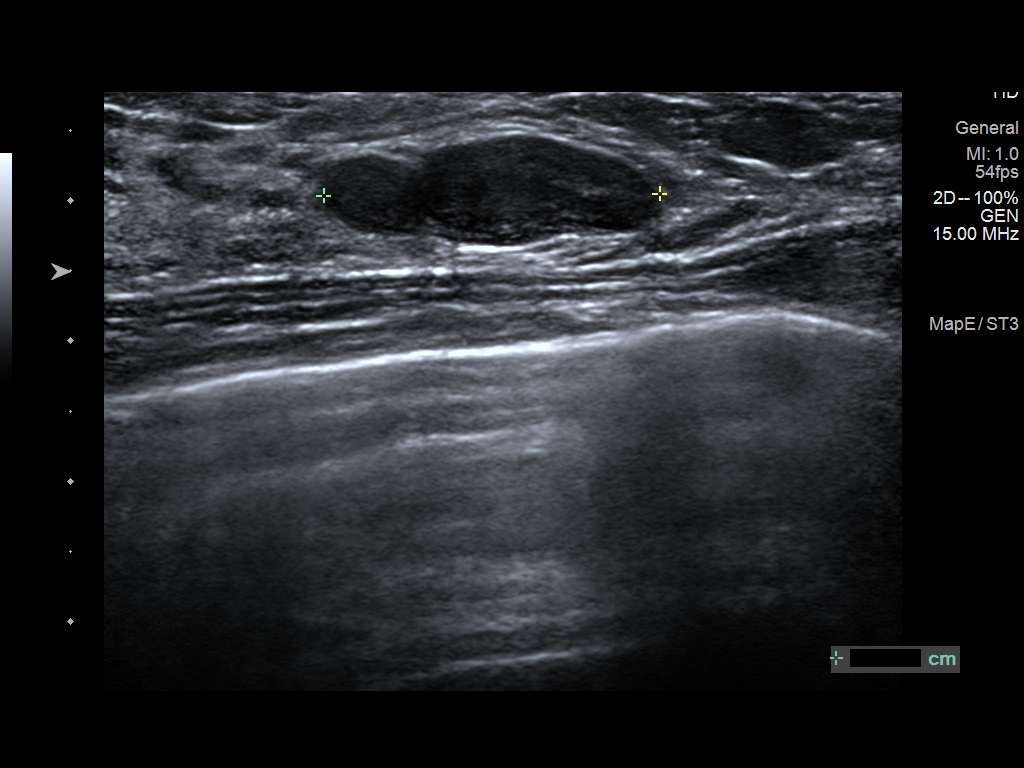
[im 3/6]
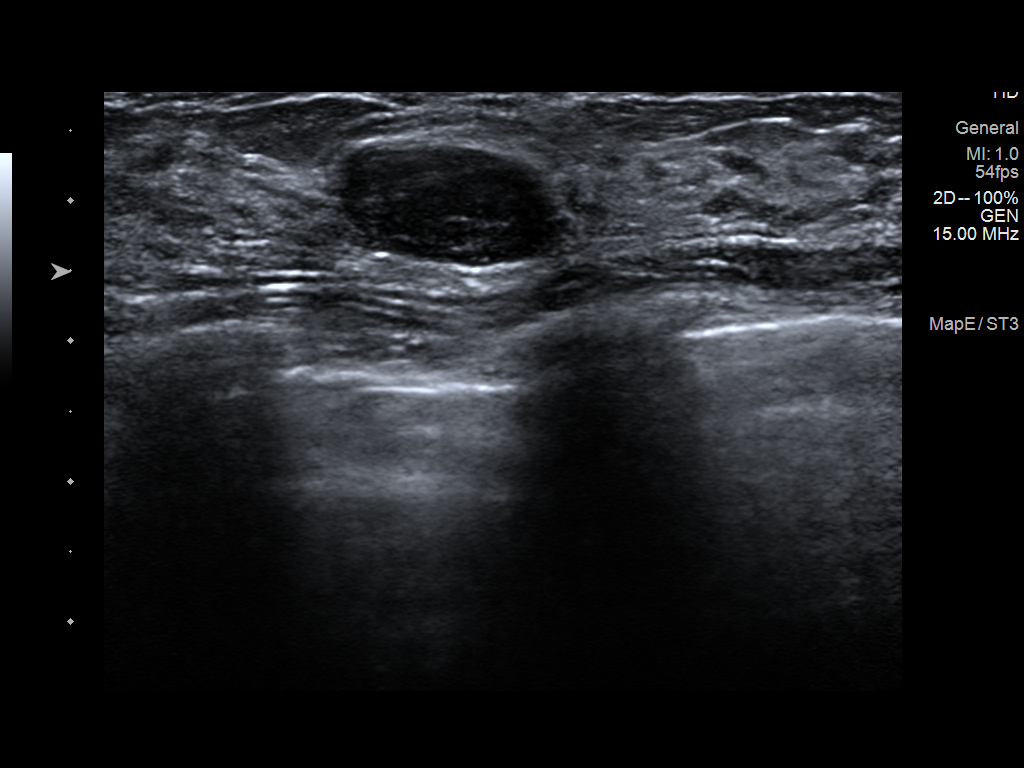
[im 4/6]
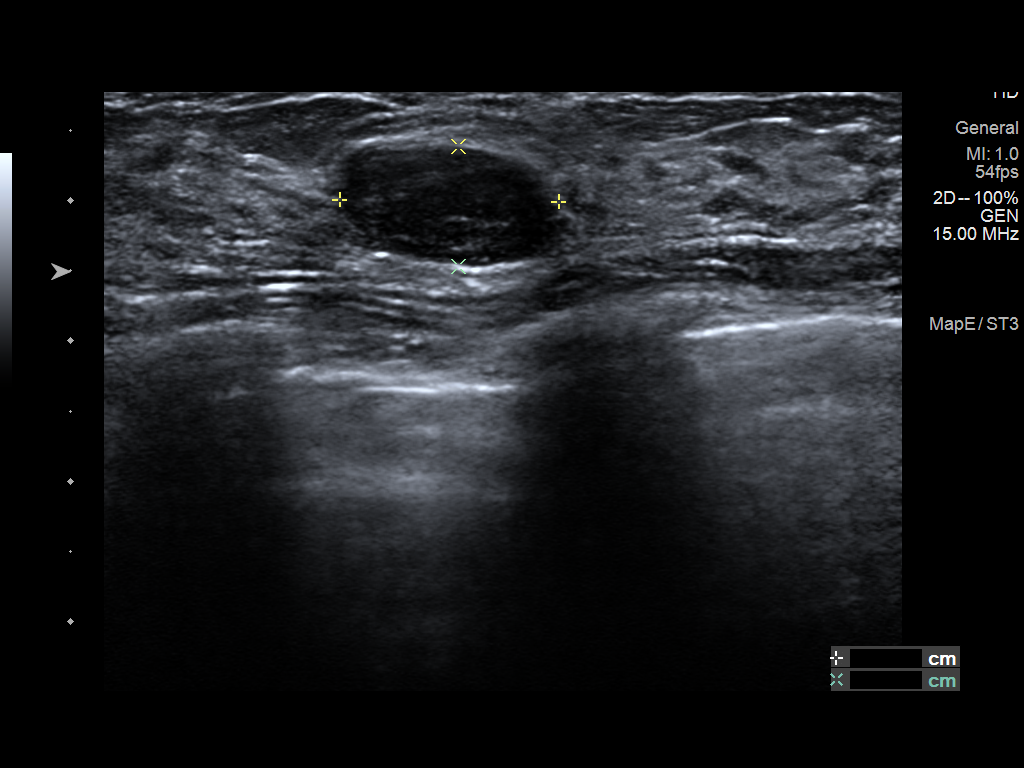
[im 5/6]
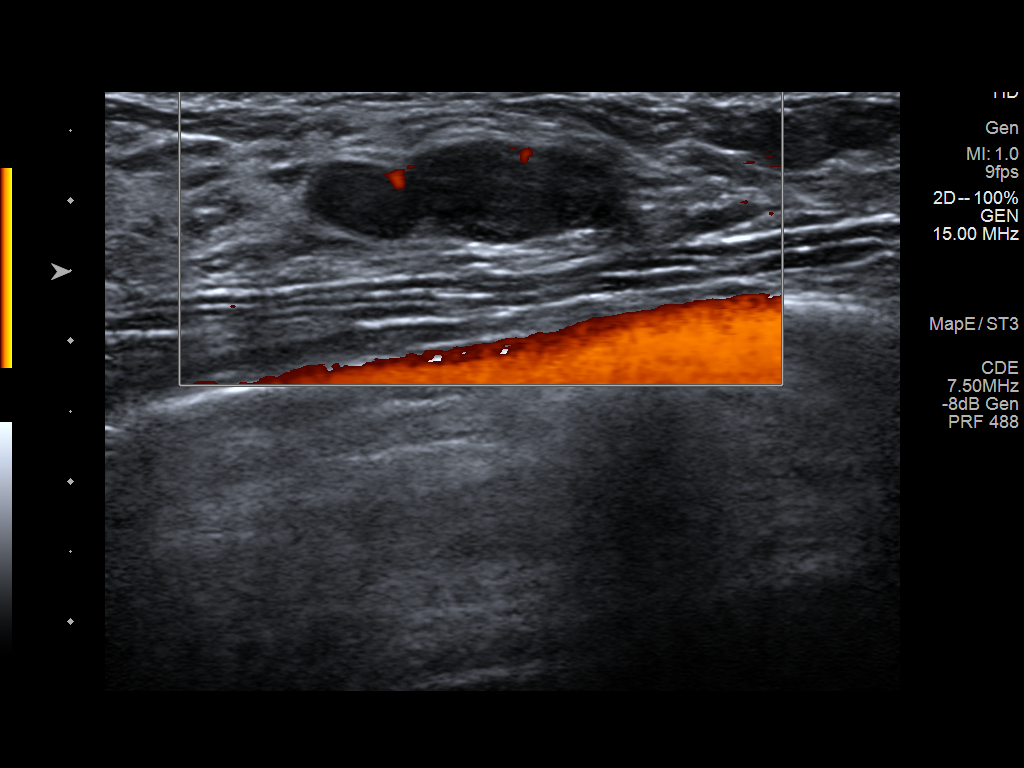
[im 6/6]
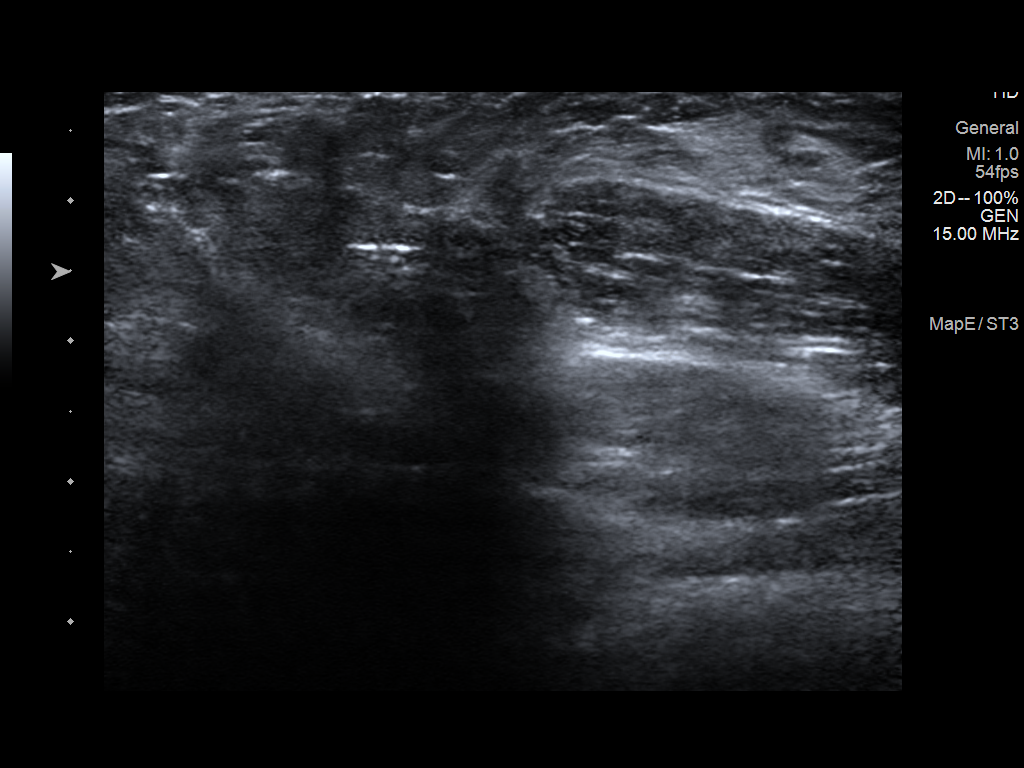

[6 of 6 positions shown; findings below may reference images not displayed]

FINDINGS: Targeted ultrasound is performed, showing a hypoechoic probably
benign mass in the right breast at 5 o'clock, 3 cm from the nipple
measuring 2.4 x 1.6 x 0.9 cm. No axillary adenopathy.
IMPRESSION: Probably benign right breast mass, likely a fibroadenoma.

RECOMMENDATION:
Six-month follow-up ultrasound of the probably benign right breast
mass.

I have discussed the findings and recommendations with the patient.
If applicable, a reminder letter will be sent to the patient
regarding the next appointment.

BI-RADS CATEGORY  3: Probably benign.
